# Patient Record
Sex: Male | Born: 1950 | Race: White | Hispanic: No | Marital: Married
Health system: Southern US, Community
[De-identification: ages and names within clinical notes are randomized; demographics above are authoritative.]

---

## 2017-01-26 ENCOUNTER — Emergency Department (HOSPITAL_COMMUNITY): Payer: 59

## 2017-01-26 ENCOUNTER — Emergency Department (HOSPITAL_COMMUNITY)
Admission: EM | Admit: 2017-01-26 | Discharge: 2017-01-26 | Disposition: A | Discharge status: Home / Self Care | Payer: 59 | Attending: Emergency Medicine | Admitting: Emergency Medicine

## 2017-01-26 ENCOUNTER — Encounter (HOSPITAL_COMMUNITY): Payer: Self-pay | Admitting: *Deleted

## 2017-01-26 DIAGNOSIS — R4182 Altered mental status, unspecified: Secondary | ICD-10-CM | POA: Insufficient documentation

## 2017-01-26 DIAGNOSIS — F10120 Alcohol abuse with intoxication, uncomplicated: Secondary | ICD-10-CM | POA: Insufficient documentation

## 2017-01-26 DIAGNOSIS — F10920 Alcohol use, unspecified with intoxication, uncomplicated: Secondary | ICD-10-CM

## 2017-01-26 LAB — SALICYLATE LEVEL

## 2017-01-26 LAB — CBC WITH DIFFERENTIAL/PLATELET
BASOS PCT: 0 %
Basophils Absolute: 0 10*3/uL (ref 0.0–0.1)
EOS ABS: 0.1 10*3/uL (ref 0.0–0.7)
Eosinophils Relative: 1 %
HCT: 43 % (ref 39.0–52.0)
HEMOGLOBIN: 14.5 g/dL (ref 13.0–17.0)
Lymphocytes Relative: 26 %
Lymphs Abs: 1.8 10*3/uL (ref 0.7–4.0)
MCH: 30.9 pg (ref 26.0–34.0)
MCHC: 33.7 g/dL (ref 30.0–36.0)
MCV: 91.7 fL (ref 78.0–100.0)
Monocytes Absolute: 0.3 10*3/uL (ref 0.1–1.0)
Monocytes Relative: 4 %
NEUTROS PCT: 69 %
Neutro Abs: 4.7 10*3/uL (ref 1.7–7.7)
PLATELETS: 149 10*3/uL — AB (ref 150–400)
RBC: 4.69 MIL/uL (ref 4.22–5.81)
RDW: 15.3 % (ref 11.5–15.5)
WBC: 6.8 10*3/uL (ref 4.0–10.5)

## 2017-01-26 LAB — COMPREHENSIVE METABOLIC PANEL
ALBUMIN: 3.8 g/dL (ref 3.5–5.0)
ALK PHOS: 59 U/L (ref 38–126)
ALT: 25 U/L (ref 17–63)
ANION GAP: 10 (ref 5–15)
AST: 32 U/L (ref 15–41)
BUN: 16 mg/dL (ref 6–20)
CALCIUM: 8.9 mg/dL (ref 8.9–10.3)
CHLORIDE: 110 mmol/L (ref 101–111)
CO2: 25 mmol/L (ref 22–32)
CREATININE: 0.79 mg/dL (ref 0.61–1.24)
GFR calc Af Amer: >60 mL/min (ref >60)
GFR calc non Af Amer: >60 mL/min (ref >60)
GLUCOSE: 107 mg/dL — AB (ref 65–99)
Potassium: 3.8 mmol/L (ref 3.5–5.1)
SODIUM: 145 mmol/L (ref 135–145)
Total Bilirubin: 0.7 mg/dL (ref 0.3–1.2)
Total Protein: 6.1 g/dL — ABNORMAL LOW (ref 6.5–8.1)

## 2017-01-26 LAB — CBG MONITORING, ED: GLUCOSE-CAPILLARY: 107 mg/dL — AB (ref 65–99)

## 2017-01-26 LAB — ACETAMINOPHEN LEVEL: Acetaminophen (Tylenol), Serum: <10 ug/mL — ABNORMAL LOW (ref 10–30)

## 2017-01-26 LAB — ETHANOL: Alcohol, Ethyl (B): 440 mg/dL (ref <5)

## 2017-01-26 LAB — AMMONIA: Ammonia: 32 umol/L (ref 9–35)

## 2017-01-26 MED ORDER — SODIUM CHLORIDE 0.9 % IV BOLUS (SEPSIS)
1000.0000 mL | Freq: Once | INTRAVENOUS | Status: AC
  Administered 2017-01-26: 1000 mL via INTRAVENOUS

## 2017-01-26 NOTE — ED Notes (Signed)
Pt. Able to tell me his name at this time. Pt. Smiling, but unable to answer any other questions.

## 2017-01-26 NOTE — ED Triage Notes (Signed)
Pt arrived by [facility]. Pt is from out of town. Was at airport this am and coworkers report pt became confused and altered. Unsure of last seen normal. Coworkers state that "he wasn't acting himself yesterday either." on arrival, spontaneous eye opening, nonverbal and pt is sleeping. spo2 88% on room air.

## 2017-01-26 NOTE — ED Notes (Signed)
Patient transported to CT 

## 2017-01-26 NOTE — ED Provider Notes (Signed)
[facility] DEPT Provider Note   CSN: [id number] Arrival date & time: 01/26/17  0800     History   Chief Complaint Chief Complaint  Patient presents with  . Altered Mental Status    HPI Logan Freeman is a 66 y.o. male.  The history is provided by medical records and a friend. No language interpreter was used.  Altered Mental Status     Logan Freeman is a 66 y.o. male who presents to the Emergency Department from airport by EMS for altered mental status. Per co-workers at bedside, patient is in town for furniture market for the last several days and was suppose to fly out today. Over the last couple of days, coworkers noticed he seemed more withdrawn than usual and showed up for a meeting late which is atypical behavior for him. This morning, he got an [employer] to the airport, and met coworkers. While at airport, he became confused and did not know where he was. He sat down and then would not move or respond to coworkers. Patient does have history of ETOH abuse but quit three years ago and has been sober since. He has lost 30-40 lbs lately by dieting. Complained of his stomaching aching two days ago but no other complaints while at market per coworkers at bedside.   Level V caveat applies due to nonverbal, altered mental status.  History reviewed. No pertinent past medical history.  There are no active problems to display for this patient.   History reviewed. No pertinent surgical history.     Home Medications    Prior to Admission medications   Medication Sig Start Date End Date Taking? Authorizing Provider  carbamazepine (TEGRETOL) 200 MG tablet Take 200 mg by mouth daily.   Yes Historical Provider, MD  doxazosin (CARDURA) 1 MG tablet Take 1 mg by mouth daily.   Yes Historical Provider, MD  escitalopram (LEXAPRO) 20 MG tablet Take 20 mg by mouth daily.   Yes Historical Provider, MD  Glucosamine HCl (GLUCOSAMINE PO) Take 1 capsule by mouth 2 (two) times daily.   Yes Historical  Provider, MD  lisinopril (PRINIVIL,ZESTRIL) 10 MG tablet Take 10 mg by mouth daily.   Yes Historical Provider, MD  Multiple Vitamin (MULTIVITAMIN) tablet Take 1 tablet by mouth daily.   Yes Historical Provider, MD    Family History History reviewed. No pertinent family history.  Social History Social History  Substance Use Topics  . Smoking status: Not on file  . Smokeless tobacco: Not on file  . Alcohol use Not on file     Allergies   Patient has no known allergies.   Review of Systems Review of Systems  Unable to perform ROS: Patient nonverbal     Physical Exam Updated Vital Signs BP (!) 145/90   Pulse 64   Temp 99 F (37.2 C) (Rectal)   Resp 19   SpO2 99%   Physical Exam  Constitutional: He appears well-developed and well-nourished. No distress.  HENT:  Head: Normocephalic and atraumatic.  Eyes: Pupils are equal, round, and reactive to light.  Cardiovascular: Normal rate, regular rhythm and normal heart sounds.   No murmur heard. Pulmonary/Chest: Effort normal and breath sounds normal. No respiratory distress.  Abdominal: Soft. He exhibits no distension. There is no tenderness.  Musculoskeletal: He exhibits no edema.  Neurological: He is alert.  Nonverbal. Gag and corneal reflexes intact. Responds to pain in all four extremities. Will follow simple commands such as squeezing fingers. Staring forward and will not track  across the room.   Skin: Skin is warm and dry.  Nursing note and vitals reviewed.    ED Treatments / Results  Labs (all labs ordered are listed, but only abnormal results are displayed) Labs Reviewed  CBC WITH DIFFERENTIAL/PLATELET - Abnormal; Notable for the following:       Result Value   Platelets 149 (*)    All other components within normal limits  COMPREHENSIVE METABOLIC PANEL - Abnormal; Notable for the following:    Glucose, Bld 107 (*)    Total Protein 6.1 (*)    All other components within normal limits  ETHANOL - Abnormal;  Notable for the following:    Alcohol, Ethyl (B) 440 (*)    All other components within normal limits  ACETAMINOPHEN LEVEL - Abnormal; Notable for the following:    Acetaminophen (Tylenol), Serum <10 (*)    All other components within normal limits  CBG MONITORING, ED - Abnormal; Notable for the following:    Glucose-Capillary 107 (*)    All other components within normal limits  AMMONIA  SALICYLATE LEVEL  CBG MONITORING, ED    EKG  EKG Interpretation  Date/Time:  Wednesday January 26 2017 08:24:16 EDT Ventricular Rate:  62 PR Interval:    QRS Duration: 101 QT Interval:  405 QTC Calculation: 412 R Axis:   -13 Text Interpretation:  Sinus rhythm Baseline wander in lead(s) V2 No old tracing to compare Confirmed by CAMPOS  MD, KEVIN ([id number]) on 01/26/2017 8:33:40 AM       Radiology Ct Head Wo Contrast  Result Date: 01/26/2017 CLINICAL DATA:  Altered mental status EXAM: CT HEAD WITHOUT CONTRAST TECHNIQUE: Contiguous axial images were obtained from the base of the skull through the vertex without intravenous contrast. COMPARISON:  None. FINDINGS: Brain: Diffuse atrophic changes are noted. No findings to suggest acute hemorrhage, acute infarction or space-occupying mass lesion are noted. Vascular: No hyperdense vessel or unexpected calcification. Skull: Normal. Negative for fracture or focal lesion. Sinuses/Orbits: No acute finding. Other: None. IMPRESSION: Atrophic changes without acute abnormality. Electronically Signed   By: Inez Catalina M.D.   On: 01/26/2017 09:12   Dg Chest Portable 1 View  Result Date: 01/26/2017 CLINICAL DATA:  Altered mental status EXAM: PORTABLE CHEST 1 VIEW COMPARISON:  None. FINDINGS: 0903 hours. Low volume film. The cardio pericardial silhouette is enlarged. There is pulmonary vascular congestion without overt pulmonary edema. The visualized bony structures of the thorax are intact. Telemetry leads overlie the chest. IMPRESSION: Low volume film with cardiomegaly  and pulmonary vascular congestion. Electronically Signed   By: Misty Stanley M.D.   On: 01/26/2017 09:16    Procedures Procedures (including critical care time)  Medications Ordered in ED Medications  sodium chloride 0.9 % bolus 1,000 mL (0 mLs Intravenous Stopped 01/26/17 1357)     Initial Impression / Assessment and Plan / ED Course  I have reviewed the triage vital signs and the nursing notes.  Pertinent labs & imaging results that were available during my care of the patient were reviewed by me and considered in my medical decision making (see chart for details).    Logan Freeman is a 66 y.o. male who presents to ED by EMS for altered mental status. He is in town for [employer] and per coworkers at bedside, he has been acting strangely over the last several days: More withdrawn then usual, late for meetings, etc. No recent illness. He has a history of alcohol abuse, but has been sober for the  last 3 years. At the airport this morning, patient was confused and then was not responding to questions. On initial exam, patient is responding to painful stimuli but not verbal. CT head negative. Labs reviewed and notable for ETOH of 440 which is likely cause of mental status change today. Remainder of labs reassuring. CXR negative. EKG non-ischemic. Patient observed in ER for approx. 5 hours. He is now alert, has eaten, walked around ER with me with steady gait. Coworkers plan to drive him to [employer] to eat lunch and back to hotel. Discussed his ETOH use today. Appears stable for discharge to home with coworkers. All questions answered.  Patient seen by and discussed with Dr. Venora Maples who agrees with treatment plan.    Final Clinical Impressions(s) / ED Diagnoses   Final diagnoses:  Alcoholic intoxication without complication [facility])    New Prescriptions New Prescriptions   No medications on file     [facility] Ariel Dimitri, PA-C 01/26/17 [place], MD 01/27/17 [phone]

## 2017-01-26 NOTE — Discharge Instructions (Signed)
Increase hydration. Quit drinking alcohol.  Follow up with your regular physician when you return home.  Return to ER for new or worsening symptoms, any additional concerns.

## 2017-01-26 NOTE — ED Notes (Signed)
Returned from ct and remains on monitor.

## 2017-01-26 NOTE — ED Notes (Signed)
Pt. Aox4. Pt. Able to speak in full sentences.

## 2018-11-30 IMAGING — CT CT HEAD W/O CM
4 of 6 series · 17 of 30 positions shown, 18 images · non-contrast
Comparison: None.

CLINICAL DATA: Altered mental status

EXAM:
CT HEAD WITHOUT CONTRAST
TECHNIQUE: Contiguous axial images were obtained from the base of the skull
through the vertex without intravenous contrast.

[Series 3: head without · axial · non-contrast · 0.46mm/px · z∈[-40,+10]mm · 2 of 32 slices shown, 3 images]
[im 11/32  brain]
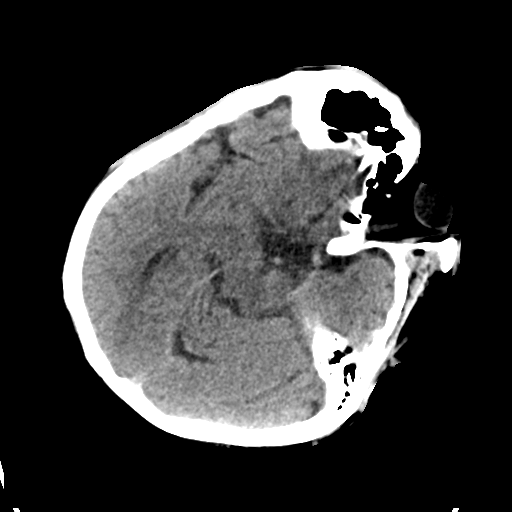
[im 11/32  bone]
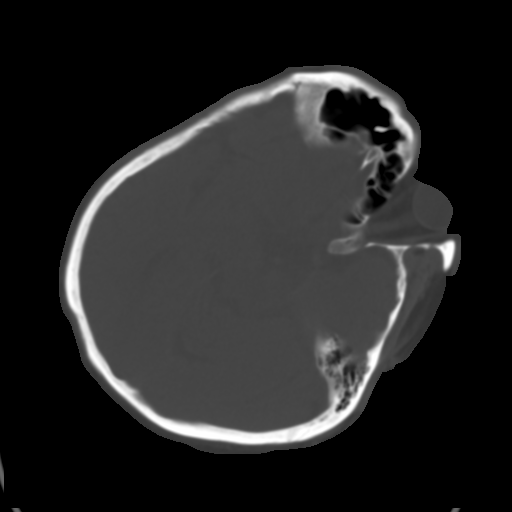
[im 21/32  brain]
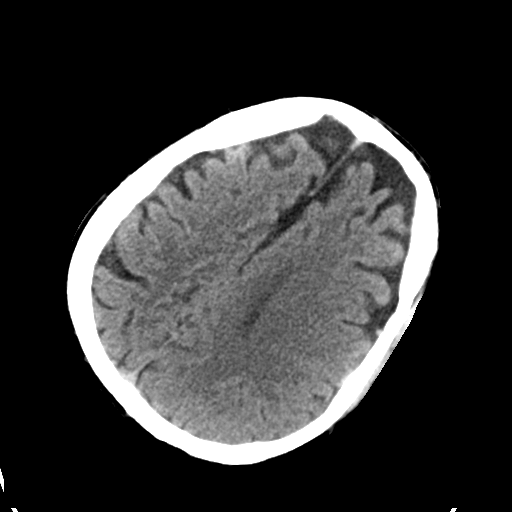

[Series 6: head without ax · axial · non-contrast · 0.37mm/px · z∈[-57,-9]mm · 2 of 32 slices shown]
[im 11/32  brain]
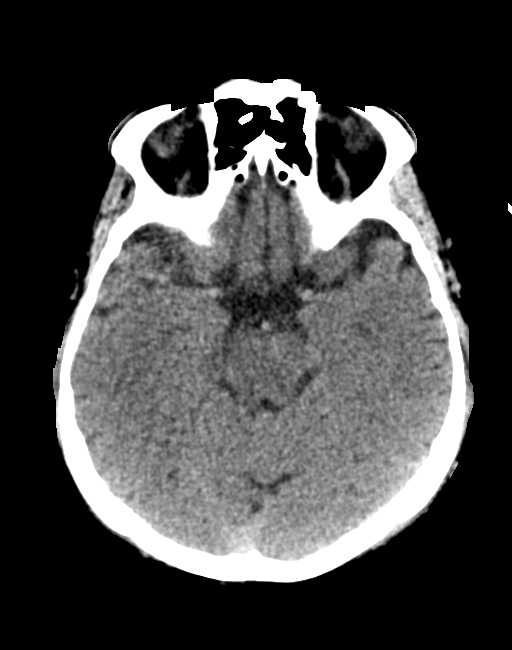
[im 21/32  brain]
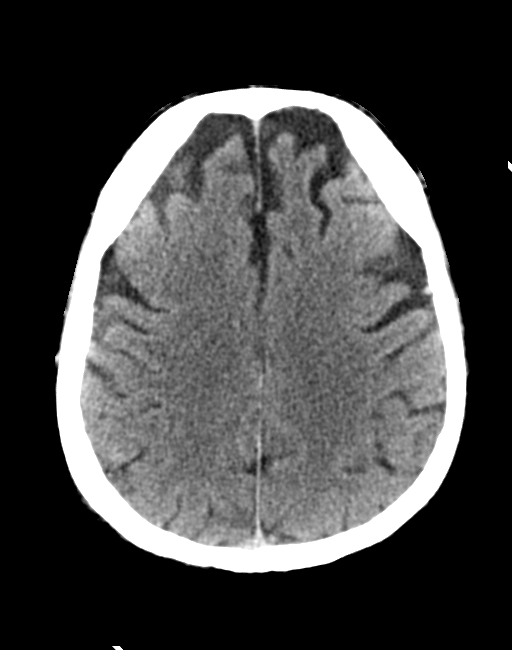

[Series 7: head bone · axial · 0.46mm/px · z∈[-74,+50]mm · 8 of 80 slices shown]
[im 9/80  bone]
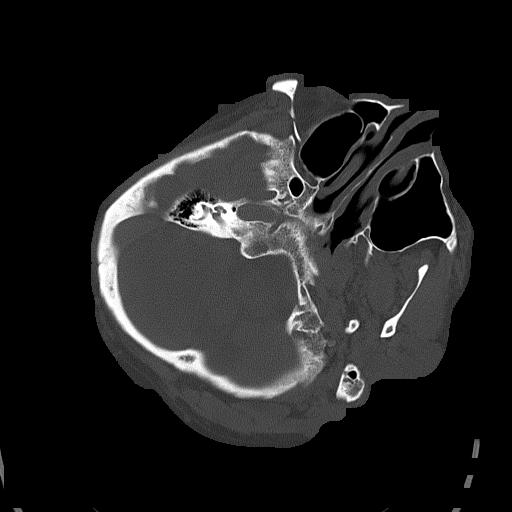
[im 18/80  bone]
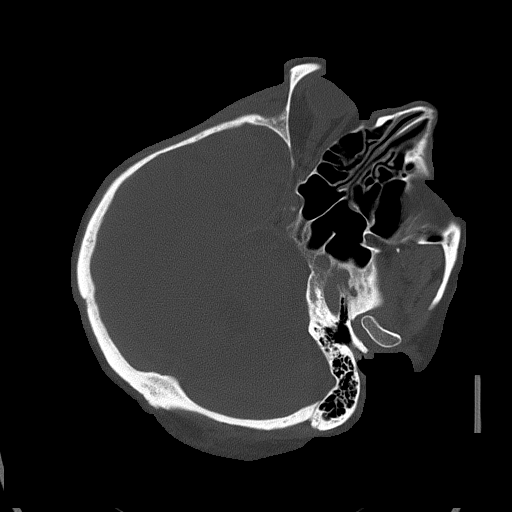
[im 27/80  bone]
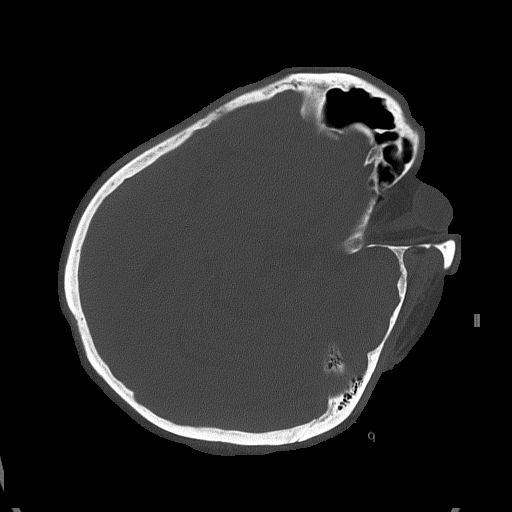
[im 36/80  bone]
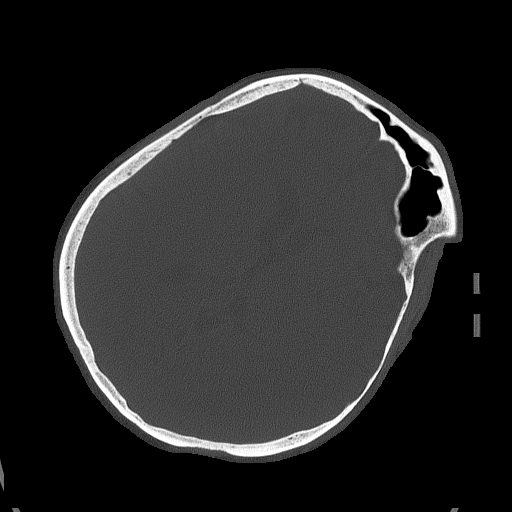
[im 44/80  bone]
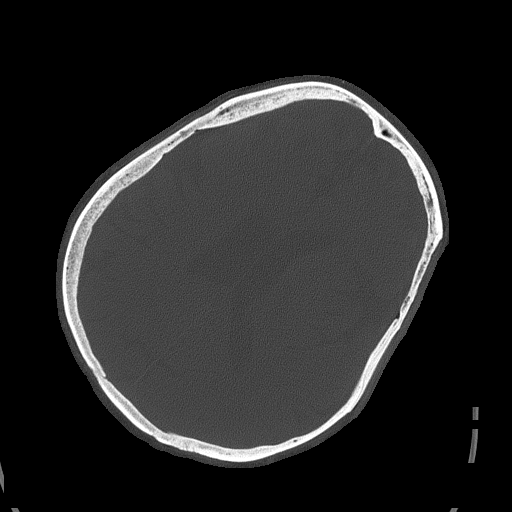
[im 53/80  bone]
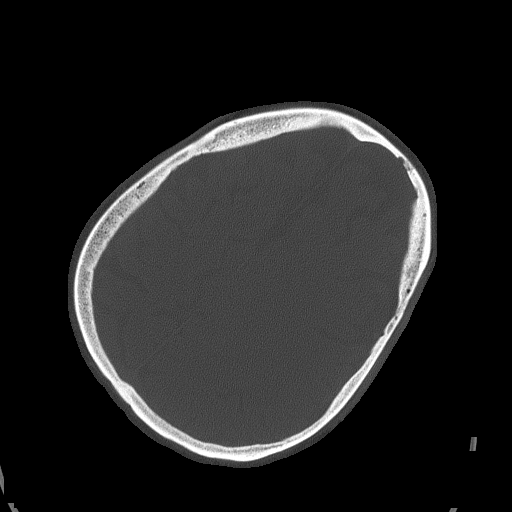
[im 62/80  bone]
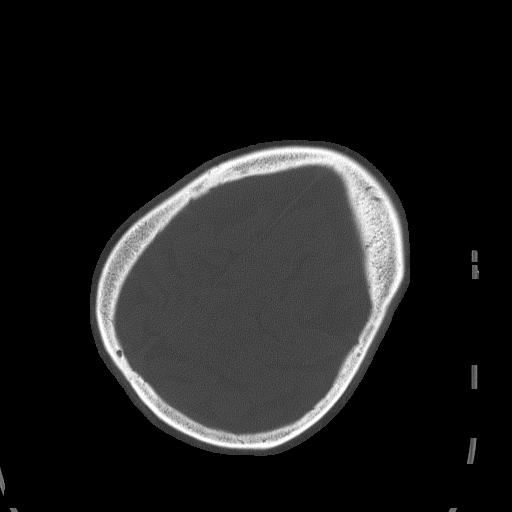
[im 71/80  bone]
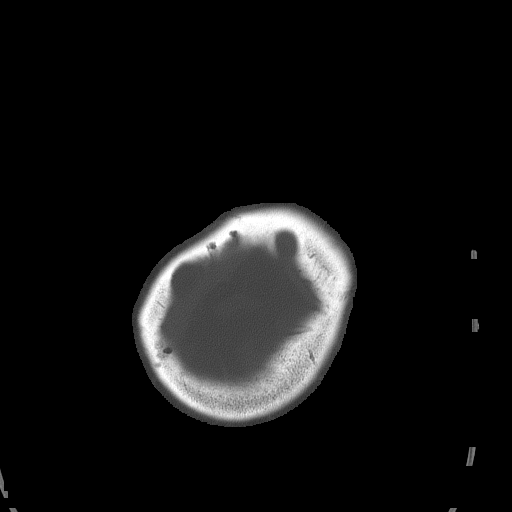

[Series 8: axial bone · axial · 0.37mm/px · z∈[-90,-14]mm · 5 of 79 slices shown]
[im 10/79  bone]
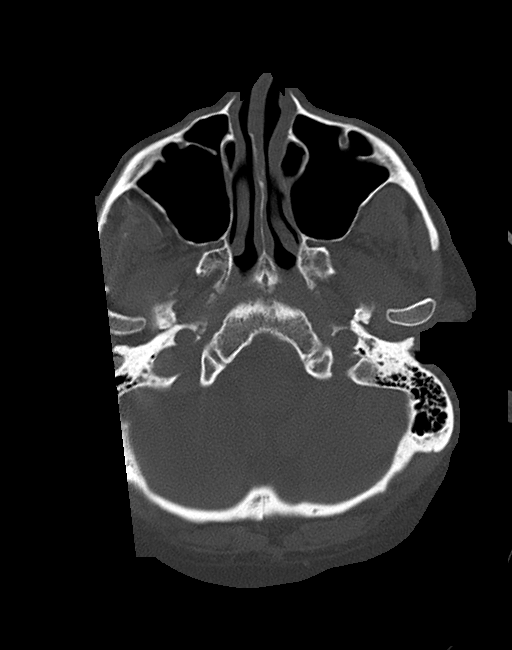
[im 20/79  bone]
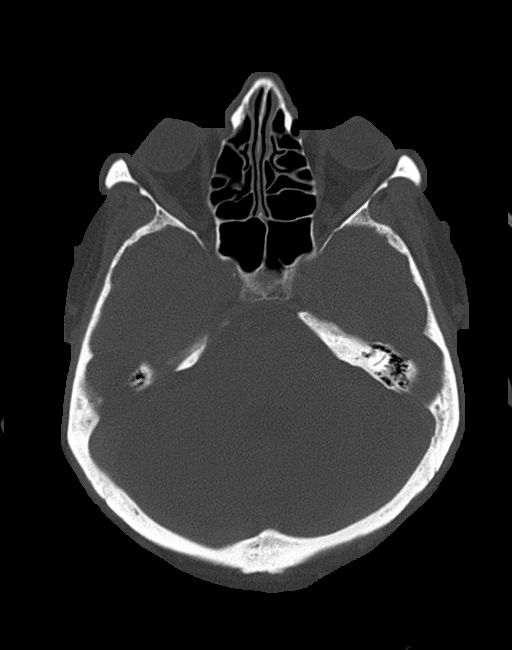
[im 30/79  bone]
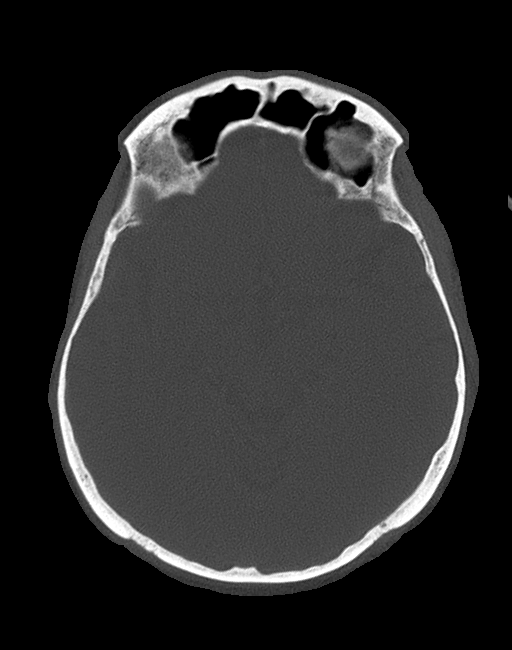
[im 40/79  bone]
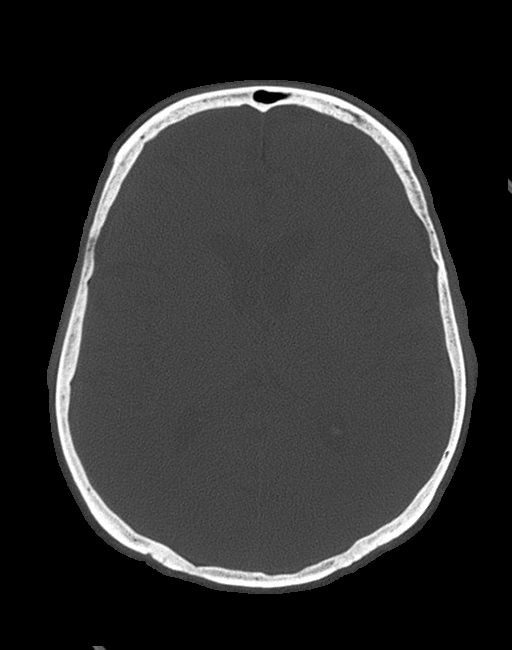
[im 49/79  bone]
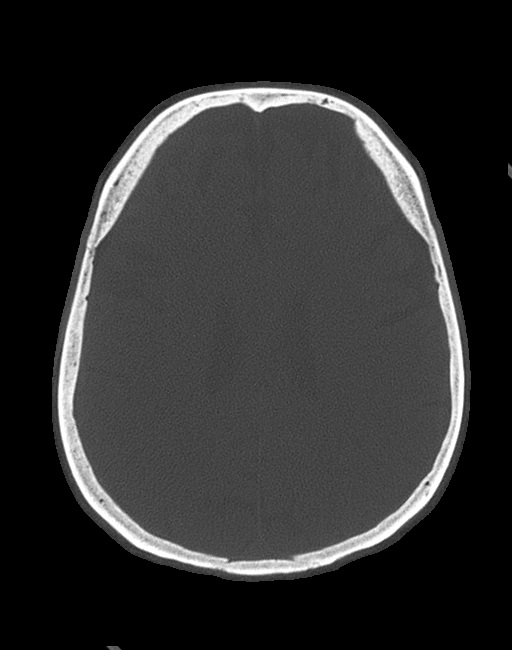

[17 of 30 positions shown; findings below may reference images not displayed]

FINDINGS: Brain: Diffuse atrophic changes are noted. No findings to suggest
acute hemorrhage, acute infarction or space-occupying mass lesion
are noted.

Vascular: No hyperdense vessel or unexpected calcification.

Skull: Normal. Negative for fracture or focal lesion.

Sinuses/Orbits: No acute finding.

Other: None.
IMPRESSION: Atrophic changes without acute abnormality.

## 2018-11-30 IMAGING — DX DG CHEST 1V PORT
1 series · 1 of 1 positions shown · non-contrast
Comparison: None.

CLINICAL DATA: Altered mental status

EXAM:
PORTABLE CHEST 1 VIEW

[chest ap]
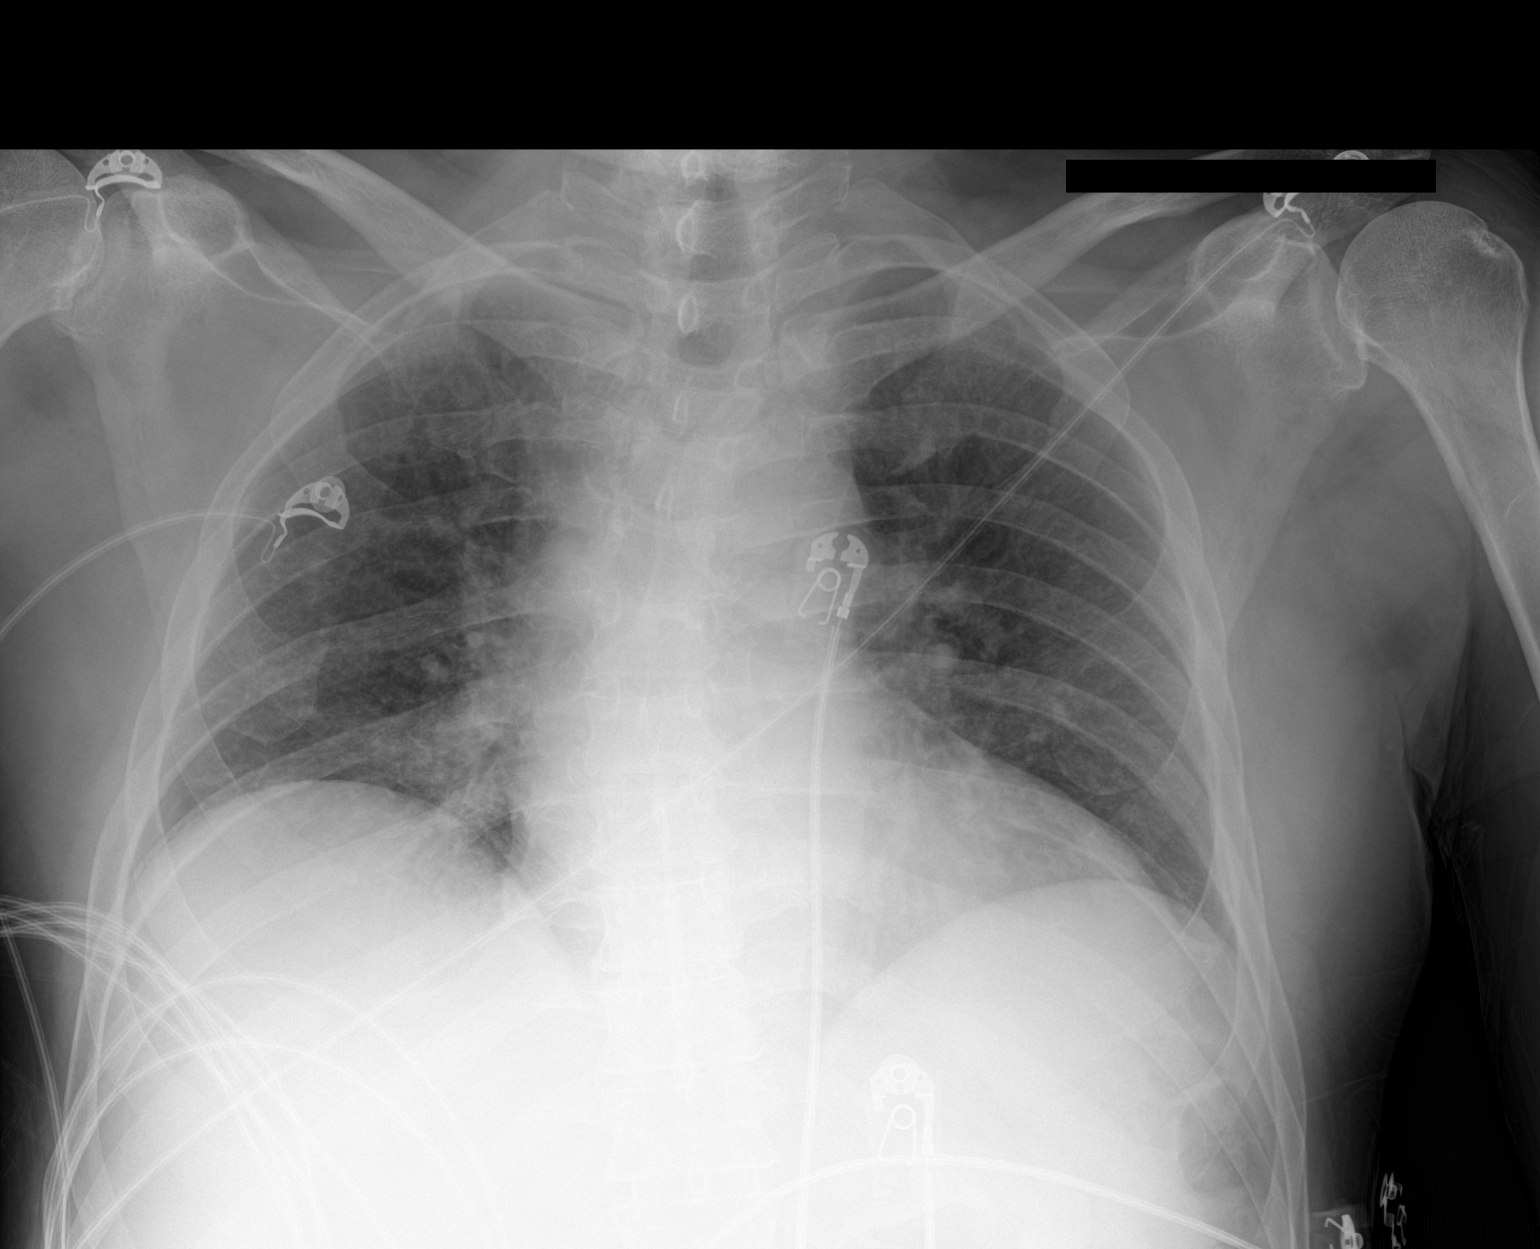

[1 of 1 positions shown; findings below may reference images not displayed]

FINDINGS: 6368 hours. Low volume film. The cardio pericardial silhouette is
enlarged. There is pulmonary vascular congestion without overt
pulmonary edema. The visualized bony structures of the thorax are
intact. Telemetry leads overlie the chest.
IMPRESSION: Low volume film with cardiomegaly and pulmonary vascular congestion.
